# Patient Record
Sex: Female | Born: 1970 | Race: Black or African American | Hispanic: No | Marital: Married | Smoking: Never smoker
Health system: Southern US, Community
[De-identification: ages and names within clinical notes are randomized; demographics above are authoritative.]

## PROBLEM LIST (undated history)

## (undated) DIAGNOSIS — I10 Essential (primary) hypertension: Secondary | ICD-10-CM

---

## 1997-10-04 ENCOUNTER — Emergency Department (HOSPITAL_COMMUNITY): Admission: EM | Admit: 1997-10-04 | Discharge: 1997-10-04 | Payer: Self-pay | Admitting: Internal Medicine

## 1997-10-05 ENCOUNTER — Encounter: Admission: RE | Admit: 1997-10-05 | Discharge: 1998-01-03 | Payer: Self-pay | Admitting: Internal Medicine

## 2002-05-16 ENCOUNTER — Other Ambulatory Visit: Admission: RE | Admit: 2002-05-16 | Discharge: 2002-05-16 | Payer: Self-pay | Admitting: Gynecology

## 2003-06-13 ENCOUNTER — Other Ambulatory Visit: Admission: RE | Admit: 2003-06-13 | Discharge: 2003-06-13 | Payer: Self-pay | Admitting: Gynecology

## 2004-02-20 ENCOUNTER — Ambulatory Visit: Payer: Self-pay | Admitting: Internal Medicine

## 2004-06-04 ENCOUNTER — Other Ambulatory Visit: Admission: RE | Admit: 2004-06-04 | Discharge: 2004-06-04 | Payer: Self-pay | Admitting: Gynecology

## 2004-08-20 ENCOUNTER — Ambulatory Visit: Payer: Self-pay | Admitting: Internal Medicine

## 2004-09-27 ENCOUNTER — Ambulatory Visit: Payer: Self-pay | Admitting: Endocrinology

## 2005-01-10 ENCOUNTER — Ambulatory Visit: Payer: Self-pay | Admitting: Internal Medicine

## 2005-07-21 ENCOUNTER — Other Ambulatory Visit: Admission: RE | Admit: 2005-07-21 | Discharge: 2005-07-21 | Payer: Self-pay | Admitting: Gynecology

## 2005-08-05 ENCOUNTER — Ambulatory Visit: Payer: Self-pay | Admitting: Internal Medicine

## 2006-08-24 ENCOUNTER — Other Ambulatory Visit: Admission: RE | Admit: 2006-08-24 | Discharge: 2006-08-24 | Payer: Self-pay | Admitting: Gynecology

## 2007-01-16 ENCOUNTER — Encounter: Payer: Self-pay | Admitting: *Deleted

## 2007-01-16 DIAGNOSIS — N750 Cyst of Bartholin's gland: Secondary | ICD-10-CM | POA: Insufficient documentation

## 2007-01-16 DIAGNOSIS — Z87898 Personal history of other specified conditions: Secondary | ICD-10-CM | POA: Insufficient documentation

## 2007-01-16 DIAGNOSIS — K219 Gastro-esophageal reflux disease without esophagitis: Secondary | ICD-10-CM | POA: Insufficient documentation

## 2007-01-16 DIAGNOSIS — J309 Allergic rhinitis, unspecified: Secondary | ICD-10-CM | POA: Insufficient documentation

## 2008-04-13 ENCOUNTER — Other Ambulatory Visit: Payer: Self-pay | Admitting: Obstetrics and Gynecology

## 2008-04-14 ENCOUNTER — Inpatient Hospital Stay (HOSPITAL_COMMUNITY): Admission: RE | Admit: 2008-04-14 | Discharge: 2008-04-17 | Payer: Self-pay | Admitting: Obstetrics and Gynecology

## 2008-04-21 ENCOUNTER — Inpatient Hospital Stay (HOSPITAL_COMMUNITY): Admission: AD | Admit: 2008-04-21 | Discharge: 2008-04-21 | Payer: Self-pay | Admitting: Obstetrics and Gynecology

## 2008-05-01 ENCOUNTER — Ambulatory Visit: Admission: RE | Admit: 2008-05-01 | Discharge: 2008-05-01 | Payer: Self-pay | Admitting: Obstetrics and Gynecology

## 2010-06-18 LAB — COMPREHENSIVE METABOLIC PANEL
ALT: 14 U/L (ref 0–35)
ALT: 15 U/L (ref 0–35)
ALT: 26 U/L (ref 0–35)
AST: 17 U/L (ref 0–37)
AST: 28 U/L (ref 0–37)
AST: 26 U/L (ref 0–37)
Albumin: 2.6 g/dL — ABNORMAL LOW (ref 3.5–5.2)
Albumin: 3.2 g/dL — ABNORMAL LOW (ref 3.5–5.2)
Albumin: 3.5 g/dL (ref 3.5–5.2)
Alkaline Phosphatase: 114 U/L (ref 39–117)
Alkaline Phosphatase: 81 U/L (ref 39–117)
Alkaline Phosphatase: 92 U/L (ref 39–117)
BUN: 8 mg/dL (ref 6–23)
BUN: 9 mg/dL (ref 6–23)
BUN: 13 mg/dL (ref 6–23)
CO2: 23 mEq/L (ref 19–32)
CO2: 25 mEq/L (ref 19–32)
CO2: 27 mEq/L (ref 19–32)
Calcium: 8.2 mg/dL — ABNORMAL LOW (ref 8.4–10.5)
Calcium: 8.5 mg/dL (ref 8.4–10.5)
Calcium: 9.1 mg/dL (ref 8.4–10.5)
Chloride: 101 mEq/L (ref 96–112)
Chloride: 104 mEq/L (ref 96–112)
Chloride: 101 mEq/L (ref 96–112)
Creatinine, Ser: 0.65 mg/dL (ref 0.4–1.2)
Creatinine, Ser: 0.68 mg/dL (ref 0.4–1.2)
Creatinine, Ser: 0.84 mg/dL (ref 0.4–1.2)
GFR calc Af Amer: >60 mL/min (ref >60)
GFR calc Af Amer: >60 mL/min (ref >60)
GFR calc Af Amer: >60 mL/min (ref >60)
GFR calc non Af Amer: >60 mL/min (ref >60)
GFR calc non Af Amer: >60 mL/min (ref >60)
GFR calc non Af Amer: >60 mL/min (ref >60)
Glucose, Bld: 106 mg/dL — ABNORMAL HIGH (ref 70–99)
Glucose, Bld: 92 mg/dL (ref 70–99)
Glucose, Bld: 94 mg/dL (ref 70–99)
Potassium: 3.7 mEq/L (ref 3.5–5.1)
Potassium: 3.9 mEq/L (ref 3.5–5.1)
Potassium: 3.9 mEq/L (ref 3.5–5.1)
Sodium: 131 mEq/L — ABNORMAL LOW (ref 135–145)
Sodium: 134 mEq/L — ABNORMAL LOW (ref 135–145)
Sodium: 135 mEq/L (ref 135–145)
Total Bilirubin: 0.3 mg/dL (ref 0.3–1.2)
Total Bilirubin: 0.3 mg/dL (ref 0.3–1.2)
Total Bilirubin: 0.4 mg/dL (ref 0.3–1.2)
Total Protein: 4.9 g/dL — ABNORMAL LOW (ref 6.0–8.3)
Total Protein: 6.1 g/dL (ref 6.0–8.3)
Total Protein: 6.2 g/dL (ref 6.0–8.3)

## 2010-06-18 LAB — CBC
HCT: 28.1 % — ABNORMAL LOW (ref 36.0–46.0)
HCT: 37.1 % (ref 36.0–46.0)
HCT: 34 % — ABNORMAL LOW (ref 36.0–46.0)
Hemoglobin: 12.3 g/dL (ref 12.0–15.0)
Hemoglobin: 9.4 g/dL — ABNORMAL LOW (ref 12.0–15.0)
Hemoglobin: 11.1 g/dL — ABNORMAL LOW (ref 12.0–15.0)
MCHC: 33.2 g/dL (ref 30.0–36.0)
MCHC: 33.6 g/dL (ref 30.0–36.0)
MCHC: 32.7 g/dL (ref 30.0–36.0)
MCV: 95.9 fL (ref 78.0–100.0)
MCV: 96.5 fL (ref 78.0–100.0)
MCV: 96.9 fL (ref 78.0–100.0)
Platelets: 175 10*3/uL (ref 150–400)
Platelets: 199 10*3/uL (ref 150–400)
Platelets: 368 10*3/uL (ref 150–400)
RBC: 2.91 MIL/uL — ABNORMAL LOW (ref 3.87–5.11)
RBC: 3.87 MIL/uL (ref 3.87–5.11)
RBC: 3.51 MIL/uL — ABNORMAL LOW (ref 3.87–5.11)
RDW: 13.8 % (ref 11.5–15.5)
RDW: 14.4 % (ref 11.5–15.5)
RDW: 14.4 % (ref 11.5–15.5)
WBC: 11.4 10*3/uL — ABNORMAL HIGH (ref 4.0–10.5)
WBC: 7.7 10*3/uL (ref 4.0–10.5)
WBC: 8 10*3/uL (ref 4.0–10.5)

## 2010-06-18 LAB — TYPE AND SCREEN
ABO/RH(D): O POS
Antibody Screen: NEGATIVE

## 2010-06-18 LAB — URINALYSIS, DIPSTICK ONLY
Bilirubin Urine: NEGATIVE
Glucose, UA: NEGATIVE mg/dL
Ketones, ur: NEGATIVE mg/dL
Leukocytes, UA: NEGATIVE
Nitrite: NEGATIVE
Protein, ur: 100 mg/dL — AB
Specific Gravity, Urine: 1.015 (ref 1.005–1.030)
Urobilinogen, UA: 0.2 mg/dL (ref 0.0–1.0)
pH: 7 (ref 5.0–8.0)

## 2010-06-18 LAB — NO BLOOD PRODUCTS

## 2010-06-18 LAB — RPR: RPR Ser Ql: NONREACTIVE

## 2010-06-18 LAB — LACTATE DEHYDROGENASE: LDH: 248 U/L (ref 94–250)

## 2010-06-18 LAB — URIC ACID
Uric Acid, Serum: 4.8 mg/dL (ref 2.4–7.0)
Uric Acid, Serum: 5.2 mg/dL (ref 2.4–7.0)
Uric Acid, Serum: 4.9 mg/dL (ref 2.4–7.0)

## 2010-06-18 LAB — ABO/RH: ABO/RH(D): O POS

## 2010-07-16 NOTE — H&P (Signed)
NAME:  Michelle Jarvis, Michelle Jarvis NO.:  [id number]   MEDICAL RECORD NO.:  [id number]          PATIENT TYPE:  INP   LOCATION:                                FACILITY:  [facility]   PHYSICIAN:  Huel Cote, M.D. DATE OF BIRTH:  04/01/1970   DATE OF ADMISSION:  04/14/2008  DATE OF DISCHARGE:                              HISTORY & PHYSICAL   Patient is a 40 year old G2, P0-0-1-0, who is being admitted at [redacted] weeks  gestation to undergo an elective C-section.  Since the beginning of her  pregnancy, the patient has remained adamant that she wishes no trial of  labor.  We have discussed in full detail the risks and benefits of  surgery, versus a normal vaginal delivery and the patient is aware of  all of this and desires to proceed with an elective cesarean section.  She has had a pregnancy which has been complicated by some elevated  blood pressures and being followed closely with twice-weekly NSTs.  Her  blood pressures first were noted to be elevated around 34 weeks and  since that time she has been followed with nonstress test and PIH labs.  She had no significant proteinuria until her last visit on February 9,  at which point she had trace to 1+ proteinuria.  Her blood pressures at  that time were 140/100 and her PIH labs were completely within normal  limits.  She did not have any pre-eclamptic symptoms and, therefore, was  monitored with a nonstress test and felt stable to await her C-section  two days later.  She otherwise has had a relatively uneventful  pregnancy.  She is AMA with a normal first-trimester screen.   PRENATAL LABS:  O-positive, antibody-negative, RPR-nonreactive, rubella-  immune, hepatitis C surface antigen-negative, HIV-negative, GC-negative,  chlamydia-negative.  One-hour Glucola was 126.  First trimester screen  was normal.  Group B strep was negative.   PAST OBSTETRICAL HISTORY:  Significant only for one elective abortion in  1999.   PAST GYN HISTORY:   She had a LEEP performed in 1995 and has had normal  Pap smears since that time.   PAST SURGICAL HISTORY:  In 1999, she had a right breast lumpectomy,  which was benign.   PAST MEDICAL HISTORY:  None significant.   PHYSICAL EXAM:  Patient's weight is 210 pounds.  As stated, blood  pressure was 140s/100.  CARDIAC EXAM:  Regular rate and rhythm.  LUNGS:  Clear.  ABDOMEN:  Soft, gravid, nontender.  Cervix is 50 and .  EXTREMITIES:  Her edema has been significant, 2 to 3+ for several weeks.  Deep tendon reflexes were 2+ and 3+, respectively on the left and the  right.   The patient reported no pre-eclamptic symptoms.  On February 10 she was  called to ensure she was feeling well.  Baby had normal movement and  there were no other issues.  As stated, her pre-eclamptic labs were  normal and she was advised to proceed to the hospital the following  morning, February 11, for her preoperative labs, which will include a  PIH panel.  The patient has been counseled extensively as to the risks  and benefits of cesarean section and it is felt she is making an  educated choice to proceed with an elective cesarean section.  She  understands that there is  a slightly higher risk of blood loss and the recovery time is longer, as  well.  We will proceed with C-section, as stated, unless the patient  develops any symptoms or issues with possible pre-eclampsia prior to the  morning of February 12, and we will repeat her labs 48 hours after the  last ones were done.      Huel Cote, M.D.  Electronically Signed     KR/MEDQ  D:  04/12/2008  T:  04/12/2008  Job:  [id number]

## 2010-07-16 NOTE — Op Note (Signed)
NAME:  Michelle Jarvis, Michelle Jarvis NO.:  [id number]   MEDICAL RECORD NO.:  [id number]          PATIENT TYPE:  INP   LOCATION:  9117                          FACILITY:  [facility]   PHYSICIAN:  Huel Cote, M.D. DATE OF BIRTH:  10/03/1970   DATE OF PROCEDURE:  04/14/2008  DATE OF DISCHARGE:                               OPERATIVE REPORT   PREOPERATIVE DIAGNOSES:  1. Term pregnancy at 39 weeks.  2. Pregnancy-induced hypertension  3. The patient requests elective cesarean section.   POSTOPERATIVE DIAGNOSES:  1. Term pregnancy at 39 weeks.  2. Pregnancy-induced hypertension  3. The patient requests elective cesarean section.   PROCEDURE:  Primary low transverse cesarean section with two-layer  closure of uterus.   SURGEON:  Huel Cote, M.D.   ASSISTANT:  Zenaida Niece, M.D.   ANESTHESIA:  Spinal.   FINDINGS:  Normal uterus, tubes, and ovaries noted.  Vigorous female  infant was delivered, in vertex presentation.  Apgars were 9 and 9.  Weight was 7 pounds 2 ounces.  There was a nuchal cord x1.   SPECIMEN:  Placenta was sent to L and D.   ESTIMATED BLOOD LOSS:  100 mL.   URINE OUTPUT:  150 mL of clear urine.   IV FLUIDS:  Approximately 2 L of lactated Ringer's.   COMPLICATIONS:  No known complications.   DESCRIPTION OF PROCEDURE:  The patient was taken to the operating room  where spinal anesthesia was obtained without difficulty.  She was then  prepped and draped in normal sterile fashion in dorsal supine position  with a leftward tilt.  A Pfannenstiel skin incision was then made and  carried through to the underlying layer of fascia by sharp dissection  and Bovie cautery.  The fascia was then nicked to the midline and the  incision was extended laterally with Mayo scissors.  The superior aspect  was grasped with the Kocher clamps, elevated and dissected off the  rectus muscles.  The inferior aspect was likewise dissected off the  rectus muscles.  These  were separated in midline and the peritoneal  cavity was entered bluntly.  Peritoneal incision was then extended both  superiorly and inferiorly with careful attention to avoid both bowel  bladder.  The Alexis self-retaining wound retractor was then placed  within the incision and the lower uterine segment exposed nicely.  The  lower uterine segment was then incised in transverse fashion to create  the bladder flap and then again to begin to make an incision on the  cavity itself.  This was incised transversely and the cavity was entered  bluntly.  The fluid was clear.  The infant's head was then delivered  atraumatically, bulb suctioned and the remainder of the body delivered  without difficulty.  There was a nuchal cord x1, which was reduced after  delivery of the head.  Cord was clamped and cut.  The infant handed to  awaiting pediatricians.  After cord blood was obtained, the placenta was  delivered from the uterus and the uterus cleared of all clots, debris  with moist lap sponge.  The uterine incision was then closed in 2  layers, the first a running locked layer of 1-0 chromic position.  This  second, an imbricating layer of the same.  The angles were noted to have  a small amount of bleeding and these were secured with additional figure-  of-eight sutures of 0 chromic, all appeared hemostatic at this point.  The tubes and ovaries were inspected.  The gutters cleared of all clots  and debris.  Some irrigation was performed in abdomen and pelvis and the  incision then carefully inspected with no active bleeding noted.  Therefore, all instruments and sponges were removed from the abdomen and  the subfascial planes inspected and found to be hemostatic.  The rectus  muscles were reapproximated with several interrupted sutures of 0  Vicryl.  The fascia was closed with 0 Vicryl in a running fashion and  the skin was closed with staples.  Sponge, lap, and needle counts were  correct x2 and  the patient was taken to the recovery room in stable  condition.      Huel Cote, M.D.  Electronically Signed     KR/MEDQ  D:  04/14/2008  T:  04/15/2008  Job:  [id number]

## 2010-07-19 NOTE — Discharge Summary (Signed)
NAME:  Michelle Jarvis, Michelle Jarvis NO.:  [id number]   MEDICAL RECORD NO.:  [id number]          PATIENT TYPE:  MAT   LOCATION:  [facility]                          [facility]ILITY:  [facility]   PHYSICIAN:  Huel Cote, M.D. DATE OF BIRTH:  October 03, 1970   DATE OF ADMISSION:  DATE OF DISCHARGE:                               DISCHARGE SUMMARY   DISCHARGE DIAGNOSES:  1. Term pregnancy at 39 weeks, delivered.  2. Pregnancy-induced hypertension.  3. Elective cesarean section, desired by the patient and performed.  4. Primary low transverse cesarean section with two-layer closure of      uterus.   DISCHARGE MEDICATIONS:  1. Motrin 600 mg p.o. every 6 hours p.r.n.  2. Oxycodone 5 mg p.o. every 6 hours p.r.n., #30.   DISCHARGE FOLLOWUP:  The patient is to follow up in the office in 2-3  days for a blood pressure check or had a called in by the [employer] visiting nurse.   HOSPITAL COURSE:  The patient is a 40 year old, G2, P0-0-1-0, who came  in for an elective scheduled primary cesarean section at 11 weeks'  gestation.  The patient declined all trial of labor and was counseled  carefully as to the pluses and minuses of an operative delivery and  desired to proceed with this route of delivery.  She underwent a primary  low transverse C-section on April 14, 2008, was delivered of a  vigorous female infant, Apgars were 9 and 9, weight was 7 pounds 2  ounces.  She had normal anatomy noted at the time of C-section.  She was  admitted for routine postoperative care.  She did fairly well except for  continued elevated blood pressures to 160s/90s.  She had no proteinuria  or abnormal PIH labs and no PIH symptoms.  By postop day #3, she was  doing quite well, tolerating regular diet.  Blood pressure was more  consistently 140s/90s and was not overall elevated enough to warrant  medications.  Therefore, she was discharged home and follow up in the  office in 2-3 days and had her staples removed  and Steri-Strips placed  prior to discharge.      Huel Cote, M.D.  Electronically Signed     KR/MEDQ  D:  05/10/2008  T:  05/11/2008  Job:  [id number]

## 2011-09-18 ENCOUNTER — Ambulatory Visit: Payer: Self-pay | Admitting: Internal Medicine

## 2011-09-18 DIAGNOSIS — Z0289 Encounter for other administrative examinations: Secondary | ICD-10-CM

## 2013-09-19 ENCOUNTER — Other Ambulatory Visit: Payer: Self-pay | Admitting: Obstetrics and Gynecology

## 2013-09-19 DIAGNOSIS — R928 Other abnormal and inconclusive findings on diagnostic imaging of breast: Secondary | ICD-10-CM

## 2013-09-22 ENCOUNTER — Ambulatory Visit
Admission: RE | Admit: 2013-09-22 | Discharge: 2013-09-22 | Disposition: A | Discharge status: Home / Self Care | Payer: BC Managed Care – PPO | Source: Ambulatory Visit | Attending: Obstetrics and Gynecology | Admitting: Obstetrics and Gynecology

## 2013-09-22 DIAGNOSIS — R928 Other abnormal and inconclusive findings on diagnostic imaging of breast: Secondary | ICD-10-CM

## 2018-05-21 ENCOUNTER — Other Ambulatory Visit: Payer: Self-pay

## 2018-05-21 ENCOUNTER — Encounter (HOSPITAL_COMMUNITY): Payer: Self-pay | Admitting: Emergency Medicine

## 2018-05-21 ENCOUNTER — Ambulatory Visit (HOSPITAL_COMMUNITY)
Admission: EM | Admit: 2018-05-21 | Discharge: 2018-05-21 | Disposition: A | Discharge status: Home / Self Care | Payer: BLUE CROSS/BLUE SHIELD | Attending: Family Medicine | Admitting: Family Medicine

## 2018-05-21 DIAGNOSIS — R1031 Right lower quadrant pain: Secondary | ICD-10-CM

## 2018-05-21 DIAGNOSIS — R300 Dysuria: Secondary | ICD-10-CM | POA: Diagnosis not present

## 2018-05-21 LAB — POCT URINALYSIS DIP (DEVICE)
Bilirubin Urine: NEGATIVE
Glucose, UA: NEGATIVE mg/dL
Hgb urine dipstick: NEGATIVE
Ketones, ur: NEGATIVE mg/dL
Leukocytes,Ua: NEGATIVE
Nitrite: NEGATIVE
Protein, ur: NEGATIVE mg/dL
Specific Gravity, Urine: 1.025 (ref 1.005–1.030)
Urobilinogen, UA: 0.2 mg/dL (ref 0.0–1.0)
pH: 6.5 (ref 5.0–8.0)

## 2018-05-21 MED ORDER — PHENAZOPYRIDINE HCL 200 MG PO TABS: 200.0000 mg | ORAL_TABLET | Freq: Three times a day (TID) | ORAL | 0 refills | Status: DC

## 2018-05-21 NOTE — ED Triage Notes (Addendum)
Pt  Is complaining of abdominal pain and is worried about a possible  UTI .Also having seasonal allergie and is having some headaches Pressure on the right side of her head. No fevers, no chills, no cough or congestion.

## 2018-05-21 NOTE — Discharge Instructions (Signed)
Increase your water intake.  Your urine is completely normal today.  Ovarian cyst, gas or appendicitis can all be considered with your right lower quadrant pain.  Please return for any worsening of pain, fevers, chills, nausea vomiting.  May use pyridium as needed for the next 2-3 days to see if this is helpful with your urine.  If your urinary symptoms worsen please return, don't take the pyridium prior to arrival as it may alter your urine sample.

## 2018-05-21 NOTE — ED Provider Notes (Signed)
[facility]    CSN: [id number] Arrival date & time: 05/21/18  1844     History   Chief Complaint Chief Complaint  Patient presents with  . Headache  . Urinary Frequency    hurting in abdomen     HPI Michelle Jarvis is a 48 y.o. female.   Michelle Jarvis presents with complaints of "noticeable discomfort" to RLQ which started three days ago. Was worse once while walking her dog, has has improved. Hasn't worsened. States she has been drinking milk with cereal which she typically doesn't, and it has caused some loose stools over the past few days. No nausea or vomiting. Has had loose stools with milk in the past. States she has some stinging sensation with urination. No frequency. States she tends to hold her urine while she works. No urgency, no blood in urine. No vaginal symptoms. She has an IUD and doesn't have periods. She has had some headache. Took ibuprofen which helped yesterday, hasn't taken today. Pain 5/10. No light or sound sensitivity. No known ill contacts. No sore throat ear pain or congestion. No new back pain. States has had a UTI in the past but her symptoms worsened over time, they have not with this.     ROS per HPI, negative if not otherwise mentioned.      History reviewed. No pertinent past medical history.  Patient Active Problem List   Diagnosis Date Noted  . ALLERGIC RHINITIS 01/16/2007  . ESOPHAGEAL REFLUX 01/16/2007  . BARTHOLIN'S CYST 01/16/2007  . HYPERLIPIDEMIA, MILD, HX OF 01/16/2007    History reviewed. No pertinent surgical history.  OB History   No obstetric history on file.      Home Medications    Prior to Admission medications   Medication Sig Start Date End Date Taking? Authorizing Provider  phenazopyridine (PYRIDIUM) 200 MG tablet Take 1 tablet (200 mg total) by mouth 3 (three) times daily. 05/21/18   Georgetta Haber, NP    Family History No family history on file.  Social History Social History   Tobacco Use  .  Smoking status: Not on file  Substance Use Topics  . Alcohol use: Not on file  . Drug use: Not on file     Allergies   Patient has no allergy information on record.   Review of Systems Review of Systems   Physical Exam Triage Vital Signs ED Triage Vitals  Enc Vitals Group     BP 05/21/18 1855 (!) 147/107     Pulse Rate 05/21/18 1855 74     Resp 05/21/18 1855 16     Temp --      Temp src --      SpO2 05/21/18 1855 100 %     Weight --      Height --      Head Circumference --      Peak Flow --      Pain Score 05/21/18 1901 6     Pain Loc --      Pain Edu? --      Excl. in GC? --    No data found.  Updated Vital Signs BP (!) 147/107 (BP Location: Right Arm)   Pulse 74   Resp 16   SpO2 100%    Physical Exam Constitutional:      General: She is not in acute distress.    Appearance: She is well-developed.  Cardiovascular:     Rate and Rhythm: Normal rate and regular rhythm.  Heart sounds: Normal heart sounds.  Pulmonary:     Effort: Pulmonary effort is normal.     Breath sounds: Normal breath sounds.  Abdominal:     Tenderness: There is abdominal tenderness in the right lower quadrant. There is no right CVA tenderness, left CVA tenderness, guarding or rebound.     Comments: Mild right lower quadrant pain on palpation, "noticeable."   Skin:    General: Skin is warm and dry.  Neurological:     Mental Status: She is alert and oriented to person, place, and time.      UC Treatments / Results  Labs (all labs ordered are listed, but only abnormal results are displayed) Labs Reviewed  POCT URINALYSIS DIP (DEVICE)    EKG None  Radiology No results found.  Procedures Procedures (including critical care time)  Medications Ordered in UC Medications - No data to display  Initial Impression / Assessment and Plan / UC Course  I have reviewed the triage vital signs and the nursing notes.  Pertinent labs & imaging results that were available during  my care of the patient were reviewed by me and considered in my medical decision making (see chart for details).     Non toxic. Afebrile. No tachycardia. Appendicitis, ovarian cyst, kidney stone, UTI, gas considered in differentials. UA completely normal tonight. Abdominal pain has not worsened, has actually improved. No cva tenderness. No vaginal symptoms. Endorses limited oral intake while working and holding her urine. Encouraged to increase fluid and watchful waiting over the next 24-48 hours with strict return precautions. Patient verbalized understanding and agreeable to plan.  Ambulatory out of clinic without difficulty.   Final Clinical Impressions(s) / UC Diagnoses   Final diagnoses:  Dysuria  Right lower quadrant abdominal pain     Discharge Instructions     Increase your water intake.  Your urine is completely normal today.  Ovarian cyst, gas or appendicitis can all be considered with your right lower quadrant pain.  Please return for any worsening of pain, fevers, chills, nausea vomiting.  May use pyridium as needed for the next 2-3 days to see if this is helpful with your urine.  If your urinary symptoms worsen please return, don't take the pyridium prior to arrival as it may alter your urine sample.     ED Prescriptions    Medication Sig Dispense Auth. Provider   phenazopyridine (PYRIDIUM) 200 MG tablet Take 1 tablet (200 mg total) by mouth 3 (three) times daily. 6 tablet Georgetta Haber, NP     Controlled Substance Prescriptions [place] Controlled Substance Registry consulted? Not Applicable   Georgetta Haber, NP 05/21/18 2008

## 2019-12-17 ENCOUNTER — Ambulatory Visit
Admission: EM | Admit: 2019-12-17 | Discharge: 2019-12-17 | Disposition: A | Discharge status: Home / Self Care | Payer: No Typology Code available for payment source

## 2019-12-17 ENCOUNTER — Ambulatory Visit (INDEPENDENT_AMBULATORY_CARE_PROVIDER_SITE_OTHER): Payer: No Typology Code available for payment source

## 2019-12-17 ENCOUNTER — Encounter: Payer: Self-pay | Admitting: Emergency Medicine

## 2019-12-17 ENCOUNTER — Other Ambulatory Visit: Payer: Self-pay

## 2019-12-17 DIAGNOSIS — R079 Chest pain, unspecified: Secondary | ICD-10-CM | POA: Diagnosis not present

## 2019-12-17 DIAGNOSIS — Z8616 Personal history of COVID-19: Secondary | ICD-10-CM

## 2019-12-17 DIAGNOSIS — W57XXXA Bitten or stung by nonvenomous insect and other nonvenomous arthropods, initial encounter: Secondary | ICD-10-CM

## 2019-12-17 DIAGNOSIS — M940 Chondrocostal junction syndrome [Tietze]: Secondary | ICD-10-CM | POA: Diagnosis not present

## 2019-12-17 DIAGNOSIS — S1086XA Insect bite of other specified part of neck, initial encounter: Secondary | ICD-10-CM

## 2019-12-17 NOTE — ED Provider Notes (Signed)
[facility]    CSN: [id number] Arrival date & time: 12/17/19  9833      History   Chief Complaint Chief Complaint  Patient presents with  . Chest Pain    post covid    HPI Michelle Jarvis is a 49 y.o. female.   49 year old female comes in for central chest pain after diagnosis of Covid last month.  Aching in sensation, constant without obvious aggravating or alleviating factor.  Denies any associated shortness of breath.  States did have 1-2 episodes of nausea in the past month, but resolves on own and has been able to tolerate oral intake.  Denies residual URI symptoms.  Did have significant headache during Covid, and has since improved but with mild residual intermittently.  Denies fever.  Since Covid, has noticed increase in fatigue, and has had some exertional fatigue.  Denies dyspnea on exertion.  Occasional exertional chest pain.  Has still been able to do normal activities.  Never smoker.  Denies personal history of heart disease.  Maternal grandfather with first MI around mid 79s.  Mother with stent placement around mid to late 67s.  Of note, patient also states had tick bite 3 to 4 weeks ago.  Denies fever, muscle/joint pain, rashes.     History reviewed. No pertinent past medical history.  Patient Active Problem List   Diagnosis Date Noted  . ALLERGIC RHINITIS 01/16/2007  . ESOPHAGEAL REFLUX 01/16/2007  . BARTHOLIN'S CYST 01/16/2007  . HYPERLIPIDEMIA, MILD, HX OF 01/16/2007    History reviewed. No pertinent surgical history.  OB History   No obstetric history on file.      Home Medications    Prior to Admission medications   Medication Sig Start Date End Date Taking? Authorizing Provider  levonorgestrel (MIRENA) 20 MCG/24HR IUD 1 each by Intrauterine route once.    [provider]  phenazopyridine (PYRIDIUM) 200 MG tablet Take 1 tablet (200 mg total) by mouth 3 (three) times daily. 05/21/18   Georgetta Haber, NP    Family  History History reviewed. No pertinent family history.  Social History Social History   Tobacco Use  . Smoking status: Never Smoker  . Smokeless tobacco: Never Used  Vaping Use  . Vaping Use: Never used  Substance Use Topics  . Alcohol use: Not on file  . Drug use: Not on file     Allergies   Patient has no known allergies.   Review of Systems Review of Systems  Reason unable to perform ROS: See HPI as above.     Physical Exam Triage Vital Signs ED Triage Vitals  Enc Vitals Group     BP 12/17/19 0954 (!) 146/93     Pulse Rate 12/17/19 0953 73     Resp 12/17/19 0953 16     Temp 12/17/19 0953 98.7 F (37.1 C)     Temp Source 12/17/19 0953 Oral     SpO2 12/17/19 0953 98 %     Weight --      Height --      Head Circumference --      Peak Flow --      Pain Score --      Pain Loc --      Pain Edu? --      Excl. in GC? --    No data found.  Updated Vital Signs BP (!) 146/93   Pulse 73   Temp 98.7 F (37.1 C) (Oral)   Resp 16  SpO2 98%   Physical Exam Constitutional:      General: She is not in acute distress.    Appearance: Normal appearance. She is well-developed. She is not toxic-appearing or diaphoretic.  HENT:     Head: Normocephalic and atraumatic.  Eyes:     Conjunctiva/sclera: Conjunctivae normal.     Pupils: Pupils are equal, round, and reactive to light.  Cardiovascular:     Rate and Rhythm: Normal rate and regular rhythm.     Heart sounds: Normal heart sounds. No murmur heard.  No friction rub. No gallop.   Pulmonary:     Effort: Pulmonary effort is normal. No respiratory distress.     Comments: LCTAB Chest:     Comments: Tenderness along sternum/right chest Abdominal:     General: Bowel sounds are normal.     Palpations: Abdomen is soft.     Tenderness: There is no abdominal tenderness. There is no guarding or rebound.  Musculoskeletal:     Cervical back: Normal range of motion and neck supple.  Skin:    General: Skin is warm and  dry.  Neurological:     Mental Status: She is alert and oriented to person, place, and time.      UC Treatments / Results  Labs (all labs ordered are listed, but only abnormal results are displayed) Labs Reviewed  ROCKY MTN SPOTTED FVR ABS PNL(IGG+IGM)  B. BURGDORFI ANTIBODIES    EKG   Radiology DG Chest 2 View  Result Date: 12/17/2019 CLINICAL DATA:  Chest pain.  Recent COVID-19 positive EXAM: CHEST - 2 VIEW COMPARISON:  None. FINDINGS: Lungs are clear. Heart size and pulmonary vascularity are normal. No adenopathy. No pneumothorax. There is mild lower thoracic dextroscoliosis. IMPRESSION: Lungs clear.  Cardiac silhouette normal. Electronically Signed   By: Bretta Bang III M.D.   On: 12/17/2019 10:50    Procedures Procedures (including critical care time)  Medications Ordered in UC Medications - No data to display  Initial Impression / Assessment and Plan / UC Course  I have reviewed the triage vital signs and the nursing notes.  Pertinent labs & imaging results that were available during my care of the patient were reviewed by me and considered in my medical decision making (see chart for details).    EKG sinus bradycardia, 56bpm, no acute ST changes, no prior EKG for comparison.  Chest x-ray without active cardiopulmonary disease.  Chest pain reproducible by palpation.  Discussed consistent with costochondritis, which may contribute to exertional chest pain.  However, given family history, will have patient follow-up with PCP for further evaluation.  Will draw labs for RMSF, Lyme given tick bite and continued headache.  Return precautions given.  Patient expresses understanding and agrees to plan.  Final Clinical Impressions(s) / UC Diagnoses   Final diagnoses:  Costochondritis  Tick bite of other part of neck, initial encounter   ED Prescriptions    None     PDMP not reviewed this encounter.   Belinda Fisher, PA-C 12/17/19 1123

## 2019-12-17 NOTE — ED Triage Notes (Signed)
PT has been discussing BP meds with her PCP for years, but has been managing with diet and exercise. Since having COVID mid September, she she had central chest pressure and headaches.

## 2019-12-17 NOTE — Discharge Instructions (Signed)
EKG and chest xray without alarming signs. As discussed, this is likely due to inflammation to the sternum/muscles. Take ibuprofen 800mg  three times a day for 5-7 days. Please still follow up with PCP for reevaluation given family history. If sudden worsening of chest pain, develop shortness of breath, go to the emergency department for further evaluation.

## 2019-12-20 LAB — B. BURGDORFI ANTIBODIES: Lyme IgG/IgM Ab: <0.91 {ISR} (ref 0.00–0.90)

## 2019-12-20 LAB — ROCKY MTN SPOTTED FVR ABS PNL(IGG+IGM)
RMSF IgG: NEGATIVE
RMSF IgM: 0.53 index (ref 0.00–0.89)

## 2020-03-27 ENCOUNTER — Other Ambulatory Visit: Payer: Self-pay

## 2020-03-27 ENCOUNTER — Ambulatory Visit
Admission: EM | Admit: 2020-03-27 | Discharge: 2020-03-27 | Disposition: A | Discharge status: Home / Self Care | Payer: No Typology Code available for payment source | Attending: Emergency Medicine | Admitting: Emergency Medicine

## 2020-03-27 ENCOUNTER — Ambulatory Visit (INDEPENDENT_AMBULATORY_CARE_PROVIDER_SITE_OTHER): Payer: No Typology Code available for payment source

## 2020-03-27 DIAGNOSIS — S92531A Displaced fracture of distal phalanx of right lesser toe(s), initial encounter for closed fracture: Secondary | ICD-10-CM

## 2020-03-27 DIAGNOSIS — M79671 Pain in right foot: Secondary | ICD-10-CM | POA: Diagnosis not present

## 2020-03-27 NOTE — ED Provider Notes (Signed)
[facility]    CSN: [id number] Arrival date & time: 03/27/20  1258      History   Chief Complaint Chief Complaint  Patient presents with  . Toe Injury    HPI Michelle Jarvis is a 50 y.o. female   Presenting for right fourth toe pain.  States that she stubbed against a bed frame last night.  Felt like there was some deformity and swelling with minimal improvement today.  Has been icing frequently, taken ibuprofen with some relief.  History reviewed. No pertinent past medical history.  Patient Active Problem List   Diagnosis Date Noted  . ALLERGIC RHINITIS 01/16/2007  . ESOPHAGEAL REFLUX 01/16/2007  . BARTHOLIN'S CYST 01/16/2007  . HYPERLIPIDEMIA, MILD, HX OF 01/16/2007    History reviewed. No pertinent surgical history.  OB History   No obstetric history on file.      Home Medications    Prior to Admission medications   Medication Sig Start Date End Date Taking? Authorizing Provider  levonorgestrel (MIRENA) 20 MCG/24HR IUD 1 each by Intrauterine route once.    [provider]    Family History History reviewed. No pertinent family history.  Social History Social History   Tobacco Use  . Smoking status: Never Smoker  . Smokeless tobacco: Never Used  Vaping Use  . Vaping Use: Never used  Substance Use Topics  . Alcohol use: Yes  . Drug use: Not Currently     Allergies   Patient has no known allergies.   Review of Systems Review of Systems  Constitutional: Negative for fatigue and fever.  HENT: Negative for ear pain, sinus pain, sore throat and voice change.   Eyes: Negative for pain, redness and visual disturbance.  Respiratory: Negative for cough and shortness of breath.   Cardiovascular: Negative for chest pain and palpitations.  Gastrointestinal: Negative for abdominal pain, diarrhea and vomiting.  Musculoskeletal: Negative for arthralgias and myalgias.       (+) R toe pain  Skin: Negative for rash and wound.   Neurological: Negative for syncope and headaches.     Physical Exam Triage Vital Signs ED Triage Vitals  Enc Vitals Group     BP 03/27/20 1405 136/78     Pulse Rate 03/27/20 1405 74     Resp 03/27/20 1405 18     Temp 03/27/20 1405 98.5 F (36.9 C)     Temp Source 03/27/20 1405 Oral     SpO2 --      Weight --      Height --      Head Circumference --      Peak Flow --      Pain Score 03/27/20 1406 6     Pain Loc --      Pain Edu? --      Excl. in [place]? --    No data found.  Updated Vital Signs BP 136/78 (BP Location: Left Arm)   Pulse 74   Temp 98.5 F (36.9 C) (Oral)   Resp 18   Visual Acuity Right Eye Distance:   Left Eye Distance:   Bilateral Distance:    Right Eye Near:   Left Eye Near:    Bilateral Near:     Physical Exam Constitutional:      General: She is not in acute distress. HENT:     Head: Normocephalic and atraumatic.  Eyes:     General: No scleral icterus.    Pupils: Pupils are equal, round, and reactive to  light.  Cardiovascular:     Rate and Rhythm: Normal rate.  Pulmonary:     Effort: Pulmonary effort is normal.  Musculoskeletal:        General: Swelling and tenderness present. No deformity. Normal range of motion.     Comments: R 4th toe  Skin:    Coloration: Skin is not jaundiced or pale.     Findings: Bruising present.  Neurological:     General: No focal deficit present.     Mental Status: She is alert and oriented to person, place, and time.      UC Treatments / Results  Labs (all labs ordered are listed, but only abnormal results are displayed) Labs Reviewed - No data to display  EKG   Radiology DG Foot Complete Right  Result Date: 03/27/2020 CLINICAL DATA:  Injury. EXAM: RIGHT FOOT COMPLETE - 3+ VIEW COMPARISON:  No prior. FINDINGS: Minimally displaced oblique fractures noted about the proximal phalanx of the right fourth digit. No other acute bony abnormality identified. No radiopaque foreign body noted. IMPRESSION:  Displaced oblique fractures noted of the proximal phalanx of the right fourth digit. Electronically Signed   By: Maisie Fus  Register   On: 03/27/2020 14:52    Procedures Procedures (including critical care time)  Medications Ordered in UC Medications - No data to display  Initial Impression / Assessment and Plan / UC Course  I have reviewed the triage vital signs and the nursing notes.  Pertinent labs & imaging results that were available during my care of the patient were reviewed by me and considered in my medical decision making (see chart for details).     X-ray with displaced oblique fracture noted to the proximal phalanx of right fourth digit.  Placed in postop shoe, instructed follow-up with Ortho within a week.  Return precautions discussed, pt verbalized understanding and is agreeable to plan. Final Clinical Impressions(s) / UC Diagnoses   Final diagnoses:  Closed displaced fracture of distal phalanx of lesser toe of right foot, initial encounter   Discharge Instructions   None    ED Prescriptions    None     PDMP not reviewed this encounter.   Hall[place]vin, [place], [place] 03/27/20 1508

## 2020-03-27 NOTE — ED Triage Notes (Signed)
Pt states hit her rt 4th toe on her bed frame last night. Pt c/o deformity and swelling. States feeling numb today. States iced toe last night, took ibuprofen 600mg  today.

## 2021-02-14 ENCOUNTER — Other Ambulatory Visit: Payer: Self-pay

## 2021-02-14 ENCOUNTER — Ambulatory Visit (HOSPITAL_COMMUNITY)
Admission: EM | Admit: 2021-02-14 | Discharge: 2021-02-14 | Disposition: A | Discharge status: Home / Self Care | Payer: No Typology Code available for payment source | Attending: Family Medicine | Admitting: Family Medicine

## 2021-02-14 ENCOUNTER — Encounter (HOSPITAL_COMMUNITY): Payer: Self-pay | Admitting: Emergency Medicine

## 2021-02-14 DIAGNOSIS — R21 Rash and other nonspecific skin eruption: Secondary | ICD-10-CM

## 2021-02-14 DIAGNOSIS — N3001 Acute cystitis with hematuria: Secondary | ICD-10-CM

## 2021-02-14 LAB — POCT URINALYSIS DIPSTICK, ED / UC
Bilirubin Urine: NEGATIVE
Glucose, UA: NEGATIVE mg/dL
Hgb urine dipstick: NEGATIVE
Ketones, ur: NEGATIVE mg/dL
Nitrite: NEGATIVE
Protein, ur: NEGATIVE mg/dL
Specific Gravity, Urine: 1.02 (ref 1.005–1.030)
Urobilinogen, UA: 0.2 mg/dL (ref 0.0–1.0)
pH: 7 (ref 5.0–8.0)

## 2021-02-14 MED ORDER — CIPROFLOXACIN HCL 500 MG PO TABS: 500.0000 mg | ORAL_TABLET | Freq: Two times a day (BID) | ORAL | 0 refills | Status: AC

## 2021-02-14 NOTE — ED Triage Notes (Addendum)
Patient c/o dysuria x 10 days.   Patient endorses "pressure and pain while urinating" upon onset of symptoms.   Patient endorsed worsening of symptoms since onset.   Patient endorses hematuria. Patient endorses RT lower back pain. Patient states " when I drink something and eat something, I feel pressure like I got to pee and do a BM".   Patient has used AZO for 2 days with some relief of symptoms.   Patient c/o rash on LFT forearm x 8 days.   Patient endorses itchiness.   Patient states " I'm not sure if a spider bit me". Patient endorses " then the next day it felt like my right eye was swelling. Early this year in February I went to the doctor because of an insect bite on the same area".   Patient denies SOB, but " I feel uneasy".   Patient endorses RT throat irritation.   Patient has used neosporin with no relief of symptoms.

## 2021-02-14 NOTE — ED Provider Notes (Signed)
[facility]    CSN: [id number] Arrival date & time: 02/14/21  1930      History   Chief Complaint Chief Complaint  Patient presents with   Dysuria   Rash    HPI MONNETTE Jarvis is a 50 y.o. female.    Dysuria Rash Here for about 1 week h/o irritation when urinating, and then 12/7 she had more dysuria and urinary hesitancy. Also started seeing some flecks of blood. AZO helped for a few days.  Then felt a prick when she was going to the car on her left forearm on 12/12. Noted a little more of that sensation up her left arm, and later noted a little rash/redness on the forearm. Then 12/13 she noted some itching around her right eye, swelling a little around her lid. The swelling around her right eye lasted about 3 days.  She had stopped the azo before the rash began. Also her right side of her throat feels irritated.  No f/c. No URI symptoms. The dysuria/urinary irritation has returned in the last 1-2 days.  History reviewed. No pertinent past medical history.  Patient Active Problem List   Diagnosis Date Noted   ALLERGIC RHINITIS 01/16/2007   ESOPHAGEAL REFLUX 01/16/2007   BARTHOLIN'S CYST 01/16/2007   HYPERLIPIDEMIA, MILD, HX OF 01/16/2007    History reviewed. No pertinent surgical history.  OB History   No obstetric history on file.      Home Medications    Prior to Admission medications   Medication Sig Start Date End Date Taking? Authorizing Provider  ciprofloxacin (CIPRO) 500 MG tablet Take 1 tablet (500 mg total) by mouth 2 (two) times daily for 5 days. 02/14/21 02/19/21 Yes Barrett Henle, MD  levonorgestrel (MIRENA) 20 MCG/24HR IUD 1 each by Intrauterine route once.    [provider]    Family History History reviewed. No pertinent family history.  Social History Social History   Tobacco Use   Smoking status: Never   Smokeless tobacco: Never  Vaping Use   Vaping Use: Never used  Substance Use Topics   Alcohol use:  Yes   Drug use: Not Currently     Allergies   Patient has no known allergies.   Review of Systems Review of Systems  Genitourinary:  Positive for dysuria.  Skin:  Positive for rash.    Physical Exam Triage Vital Signs ED Triage Vitals  Enc Vitals Group     BP 02/14/21 1950 (!) 153/102     Pulse Rate 02/14/21 1950 68     Resp 02/14/21 1950 16     Temp 02/14/21 1950 98.8 F (37.1 C)     Temp Source 02/14/21 1950 Oral     SpO2 02/14/21 1950 98 %     Weight --      Height --      Head Circumference --      Peak Flow --      Pain Score 02/14/21 1959 0     Pain Loc --      Pain Edu? --      Excl. in [place]? --    No data found.  Updated Vital Signs BP (!) 153/102 (BP Location: Right Arm)    Pulse 68    Temp 98.8 F (37.1 C) (Oral)    Resp 16    LMP  (LMP Unknown)    SpO2 98%   Visual Acuity Right Eye Distance:   Left Eye Distance:   Bilateral Distance:  Right Eye Near:   Left Eye Near:    Bilateral Near:     Physical Exam Vitals and nursing note reviewed.  Constitutional:      General: She is not in acute distress.    Appearance: She is well-developed.  HENT:     Right Ear: Tympanic membrane normal.     Left Ear: Tympanic membrane normal.     Nose: Nose normal.     Mouth/Throat:     Mouth: Mucous membranes are moist.     Pharynx: No oropharyngeal exudate or posterior oropharyngeal erythema.  Eyes:     General:        Right eye: No discharge.        Left eye: No discharge.     Extraocular Movements: Extraocular movements intact.     Conjunctiva/sclera: Conjunctivae normal.     Pupils: Pupils are equal, round, and reactive to light.     Comments: Her upper left eye lid is slightly puffy. No erythema noted  Cardiovascular:     Rate and Rhythm: Normal rate and regular rhythm.     Heart sounds: No murmur heard. Pulmonary:     Effort: Pulmonary effort is normal. No respiratory distress.     Breath sounds: Normal breath sounds.  Abdominal:     Palpations:  Abdomen is soft.     Tenderness: There is abdominal tenderness (mild suprapubic tenderness).  Musculoskeletal:        General: No swelling.     Cervical back: Neck supple.  Skin:    Capillary Refill: Capillary refill takes less than 2 seconds.     Coloration: Skin is not jaundiced or pale.     Comments: Has an area of mild erythema, with excoriation, about 0.5 cm x 1 cm on her left forearm, not indurated.   Neurological:     General: No focal deficit present.     Mental Status: She is alert and oriented to person, place, and time.  Psychiatric:        Behavior: Behavior normal.     UC Treatments / Results  Labs (all labs ordered are listed, but only abnormal results are displayed) Labs Reviewed  POCT URINALYSIS DIPSTICK, ED / UC - Abnormal; Notable for the following components:      Result Value   Leukocytes,Ua MODERATE (*)    All other components within normal limits    EKG   Radiology No results found.  Procedures Procedures (including critical care time)  Medications Ordered in UC Medications - No data to display  Initial Impression / Assessment and Plan / UC Course  I have reviewed the triage vital signs and the nursing notes.  Pertinent labs & imaging results that were available during my care of the patient were reviewed by me and considered in my medical decision making (see chart for details).     UA shows moderate amount of WBC.  Suspect cystitis with her symptoms.  Also, consider allergic reaction--could be to insect, could be environmental, or ??to the AZO, delayed. She can take benadryl prn allergy symptoms   Final Clinical Impressions(s) / UC Diagnoses   Final diagnoses:  Acute cystitis with hematuria  Rash     Discharge Instructions      I do think you have a urinary infection, so I want you to take cipro 500 mg twice daily for 5 days. Drink plenty of fluids.  And you may be having allergy to something, but I am uncertain if it were an  insect sting, or to something inhaled, or to something in your food. I would like you to take benadryl as needed for the itching and allergy symptoms, every 6 hours.   Possibly you should see your primary provider about the allergy symptoms; they may end up referring you to an allergist.     ED Prescriptions     Medication Sig Dispense Auth. Provider   ciprofloxacin (CIPRO) 500 MG tablet Take 1 tablet (500 mg total) by mouth 2 (two) times daily for 5 days. 10 tablet Marlinda Mike Janace Aris, MD      PDMP not reviewed this encounter.   Zenia Resides, MD 02/14/21 2022

## 2021-02-14 NOTE — Discharge Instructions (Addendum)
I do think you have a urinary infection, so I want you to take cipro 500 mg twice daily for 5 days. Drink plenty of fluids.  And you may be having allergy to something, but I am uncertain if it were an insect sting, or to something inhaled, or to something in your food. I would like you to take benadryl as needed for the itching and allergy symptoms, every 6 hours.   Possibly you should see your primary provider about the allergy symptoms; they may end up referring you to an allergist.

## 2022-01-13 ENCOUNTER — Encounter (HOSPITAL_BASED_OUTPATIENT_CLINIC_OR_DEPARTMENT_OTHER): Payer: Self-pay | Admitting: Pediatrics

## 2022-01-13 ENCOUNTER — Emergency Department (HOSPITAL_BASED_OUTPATIENT_CLINIC_OR_DEPARTMENT_OTHER)
Admission: EM | Admit: 2022-01-13 | Discharge: 2022-01-13 | Disposition: A | Discharge status: Home / Self Care | Payer: No Typology Code available for payment source | Attending: Emergency Medicine | Admitting: Emergency Medicine

## 2022-01-13 ENCOUNTER — Other Ambulatory Visit: Payer: Self-pay

## 2022-01-13 DIAGNOSIS — Y92410 Unspecified street and highway as the place of occurrence of the external cause: Secondary | ICD-10-CM | POA: Insufficient documentation

## 2022-01-13 DIAGNOSIS — R079 Chest pain, unspecified: Secondary | ICD-10-CM | POA: Diagnosis not present

## 2022-01-13 DIAGNOSIS — M546 Pain in thoracic spine: Secondary | ICD-10-CM | POA: Diagnosis not present

## 2022-01-13 DIAGNOSIS — M79601 Pain in right arm: Secondary | ICD-10-CM | POA: Diagnosis not present

## 2022-01-13 DIAGNOSIS — M542 Cervicalgia: Secondary | ICD-10-CM | POA: Insufficient documentation

## 2022-01-13 HISTORY — DX: Essential (primary) hypertension: I10

## 2022-01-13 MED ORDER — KETOROLAC TROMETHAMINE 15 MG/ML IJ SOLN
15.0000 mg | Freq: Once | INTRAMUSCULAR | Status: AC
  Administered 2022-01-13: 15 mg via INTRAMUSCULAR
  Filled 2022-01-13: qty 1

## 2022-01-13 NOTE — Discharge Instructions (Signed)
Take 4 over the counter ibuprofen tablets 3 times a day or 2 over-the-counter naproxen tablets twice a day for pain. Also take tylenol 1000mg(2 extra strength) four times a day.    
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
## 2022-01-13 NOTE — ED Notes (Signed)
Discharge instructions reviewed with patient. Patient verbalizes understanding, no further questions at this time. Medications and follow up information provided. No acute distress noted at time of departure.  
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
## 2022-08-22 IMAGING — DX DG FOOT COMPLETE 3+V*R*
3 series · 3 of 3 positions shown · non-contrast
Comparison: No prior.

CLINICAL DATA: Injury.

EXAM:
RIGHT FOOT COMPLETE - 3+ VIEW

[foot supine dp]
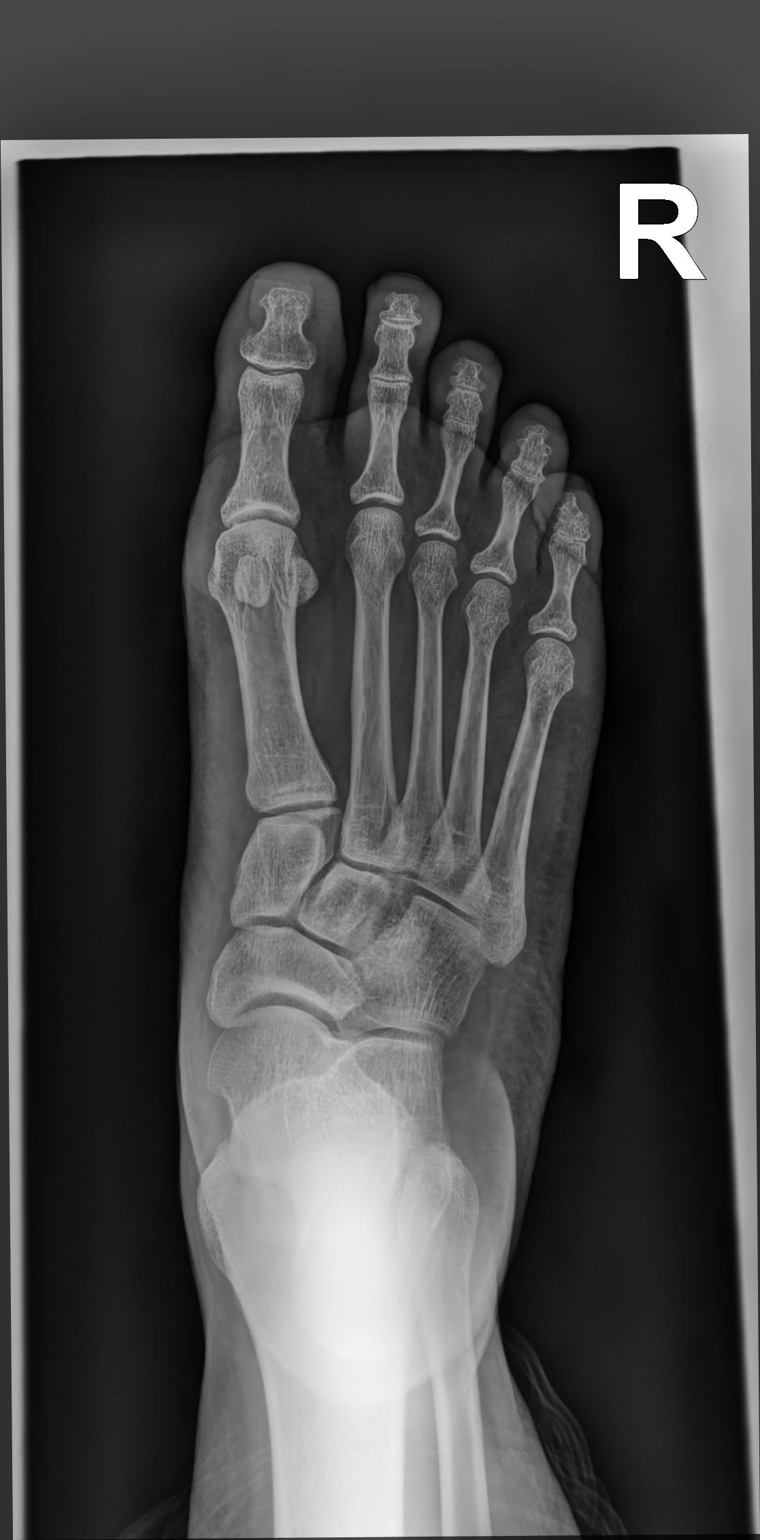

[foot medial oblique]
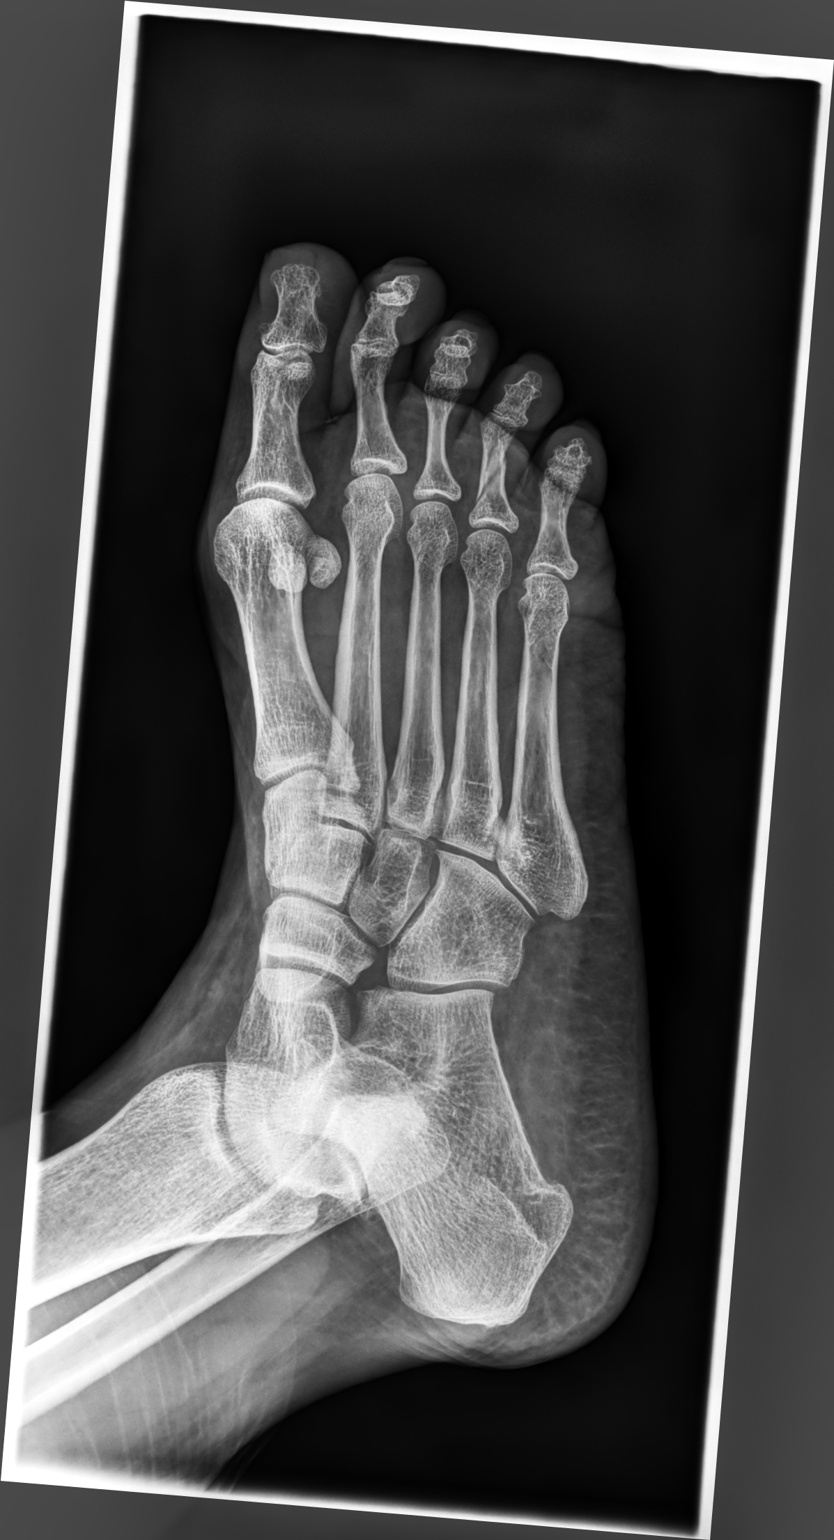

[foot supine lat]
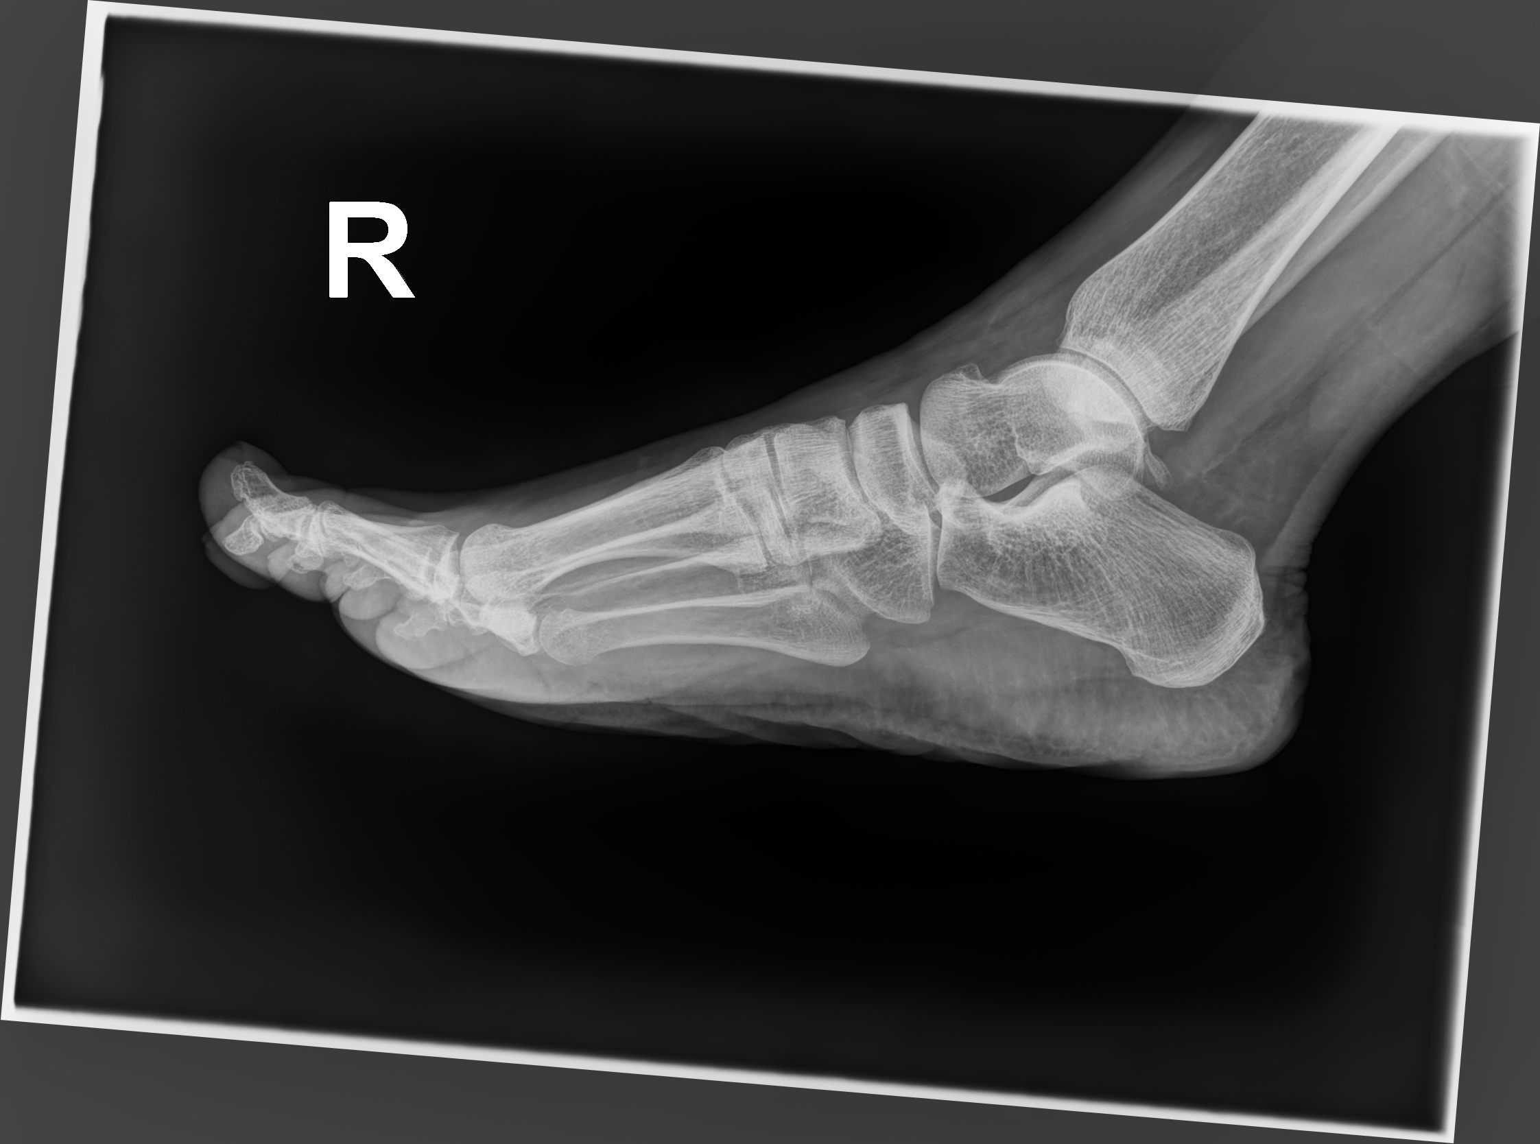

[3 of 3 positions shown; findings below may reference images not displayed]

FINDINGS: Minimally displaced oblique fractures noted about the proximal
phalanx of the right fourth digit. No other acute bony abnormality
identified. No radiopaque foreign body noted.
IMPRESSION: Displaced oblique fractures noted of the proximal phalanx of the
right fourth digit.

## 2023-07-03 ENCOUNTER — Ambulatory Visit
Admission: EM | Admit: 2023-07-03 | Discharge: 2023-07-03 | Disposition: A | Discharge status: Home / Self Care | Payer: Self-pay | Attending: Physician Assistant | Admitting: Physician Assistant

## 2023-07-03 ENCOUNTER — Other Ambulatory Visit: Payer: Self-pay

## 2023-07-03 DIAGNOSIS — R109 Unspecified abdominal pain: Secondary | ICD-10-CM

## 2023-07-03 DIAGNOSIS — R519 Headache, unspecified: Secondary | ICD-10-CM

## 2023-07-03 DIAGNOSIS — R030 Elevated blood-pressure reading, without diagnosis of hypertension: Secondary | ICD-10-CM

## 2023-07-03 NOTE — Discharge Instructions (Addendum)
 You were seen today for concerns of abdominal discomfort, burping and bowel habit changes as well as headaches. The symptoms can have a variety of causes that can be difficult to narrow down. At this time some of your abdominal concerns appear consistent with acid reflux or GERD.  I recommend starting with an acid reduction medication such as Pepcid once a day for at least 2 weeks.  I also recommend taking an antacid such as Pepto or Tums if you are having more severe symptoms. For your bowel habit changes I recommend trying changes to your diet such as increasing your fiber and avoiding GERD triggers.  I have included some patient education materials in your after visit summary for you to review to help with this.  If at any point you start to have severe abdominal pain, fever or chills that are not responding to over-the-counter medication, blood in your stool, difficulty having a bowel movement, severe nausea vomiting or diarrhea that prevent you from eating or drinking please go to the emergency room as these could be signs of a medical emergency.  Your headache symptoms are another condition that can have several different causes.  One could be mild dehydration or electrolyte imbalance caused by decreased eating.  Another could be elevated blood pressure.  Your blood pressure was mildly elevated here in clinic today but we typically do not start blood pressure medications for 1 individual blood pressure reading. I usually recommend that patients check their blood pressure a few times a week and record this and bring in records to discuss with her PCP.  Blood pressures taken at home and what can be seen in doctors offices typically provides a much more clear picture that is more accurate to determine if someone has high blood pressure and medication is indicated. For your headaches I recommend alternating Tylenol and ibuprofen as needed for pain management.  If at any point you feel like your headaches  are getting worse, it becomes the worst headache of your life, hits you very suddenly like a thunderclap, you start to develop difficulty speaking, you develop facial drooping, numbness or tingling on one side of the body, loss of consciousness please call EMS or go to the emergency room for further evaluation as these could be signs of a medical emergency.  We will keep you updated on the results of your lab work once it is available.  If any medications or adjustments to your management plan need to be made we will keep you updated and informed of those changes.  Since we were not able to assist you with establishing at a primary care provider's office I have linked the website you can use to make yourself an appointment at your earliest convenience for further evaluation as I do recommend ongoing evaluation for your concerns especially if they are not getting better.  [email or web address]

## 2023-07-03 NOTE — ED Triage Notes (Signed)
 Pt presents with complaints of headaches and upset stomach x 1 week. Pt states she has a decreased appetite, "feeling off overall". Pt has been feeling hot and then cold. OTC Ibuprofen taken with little relief. Pt currently rates her headache pain a 7/10.   Pt is also voicing concerns of elevated blood pressure. Pt did take medicine for her blood pressure about two years ago and then stopped. Currently not on medicine for BP.

## 2023-07-03 NOTE — ED Notes (Signed)
 This RN went in to schedule patient's primary care appointment. Pt left following blood work and speaking with [place] PA.

## 2023-07-03 NOTE — ED Provider Notes (Addendum)
 [facility]    CSN: [id number] Arrival date & time: 07/03/23  1607      History   Chief Complaint Chief Complaint  Patient presents with   Headache    HPI Michelle Jarvis is a 53 y.o. female.   HPI  She reports she has been having headaches, upset stomach, intermittent stool changes, appetite decreased She reports this has been ongoing for about a week   Abdominal symptoms She reports having bubbling and intermittent pain in abdomen. She reports increased burping She denies severe pain but there sometimes is mild discomfort and decreased appetite She reports her pain/discomfort is generalized She states discomfort seems to improve with PO intake and eating  She states she has had some bowel changes- not really soft or hard  but it is not regular for her  She reports stools are soft and seem to have increased odor  She reports about a month ago, she and her husband had a stomach illness with nausea, vomiting, upset stomach that took about a week to recover from  She also reports chills over the past few days   Headache She reports she has been having recurrent headache She states pain is oscillating between 3/10 to 6-7/10 She denies recent head injury or trauma She reports Ibuprofen sometimes helps with headache but she is not sure if this is truly helping   She reports earlier this week she has been feeling fatigued and tired - wanted to sleep more than usual   She admits that several years ago she was on a medication for blood pressure control but is not sure what this was and has not seen a PCP in some time for follow-up.  Past Medical History:  Diagnosis Date   Hypertension     Patient Active Problem List   Diagnosis Date Noted   ALLERGIC RHINITIS 01/16/2007   ESOPHAGEAL REFLUX 01/16/2007   BARTHOLIN'S CYST 01/16/2007   HYPERLIPIDEMIA, MILD, HX OF 01/16/2007    History reviewed. No pertinent surgical history.  OB History   No obstetric  history on file.      Home Medications    Prior to Admission medications   Medication Sig Start Date End Date Taking? Authorizing Provider  levonorgestrel (MIRENA) 20 MCG/24HR IUD 1 each by Intrauterine route once.    [provider]    Family History History reviewed. No pertinent family history.  Social History Social History   Tobacco Use   Smoking status: Never   Smokeless tobacco: Never  Vaping Use   Vaping status: Never Used  Substance Use Topics   Alcohol use: Yes   Drug use: Not Currently     Allergies   Patient has no known allergies.   Review of Systems Review of Systems  Constitutional:  Positive for appetite change, chills and fatigue. Negative for fever.  Gastrointestinal:  Positive for abdominal pain. Negative for blood in stool, constipation, diarrhea, nausea and vomiting.  Neurological:  Positive for light-headedness and headaches. Negative for dizziness.     Physical Exam Triage Vital Signs ED Triage Vitals  Encounter Vitals Group     BP 07/03/23 1623 (!) 149/97     Systolic BP Percentile --      Diastolic BP Percentile --      Pulse Rate 07/03/23 1623 74     Resp 07/03/23 1623 18     Temp 07/03/23 1623 97.9 F (36.6 C)     Temp Source 07/03/23 1623 Oral  SpO2 07/03/23 1623 98 %     Weight 07/03/23 1626 180 lb (81.6 kg)     Height 07/03/23 1626 5' 2.5" (1.588 m)     Head Circumference --      Peak Flow --      Pain Score 07/03/23 1625 7     Pain Loc --      Pain Education --      Exclude from Growth Chart --    No data found.  Updated Vital Signs BP (!) 149/97 (BP Location: Right Arm)   Pulse 74   Temp 97.9 F (36.6 C) (Oral)   Resp 18   Ht 5' 2.5" (1.588 m)   Wt 180 lb (81.6 kg)   SpO2 98%   BMI 32.40 kg/m   Visual Acuity Right Eye Distance:   Left Eye Distance:   Bilateral Distance:    Right Eye Near:   Left Eye Near:    Bilateral Near:     Physical Exam Vitals reviewed.  Constitutional:       General: She is awake. She is not in acute distress.    Appearance: Normal appearance. She is well-developed and well-groomed. She is not ill-appearing or toxic-appearing.  HENT:     Head: Normocephalic and atraumatic.     Right Ear: Hearing, tympanic membrane and ear canal normal.     Left Ear: Hearing, tympanic membrane and ear canal normal.     Mouth/Throat:     Lips: Pink.     Mouth: Mucous membranes are moist.     Pharynx: Oropharynx is clear. Uvula midline.  Eyes:     General: Lids are normal. Gaze aligned appropriately.     Extraocular Movements: Extraocular movements intact.     Right eye: Normal extraocular motion and no nystagmus.     Left eye: Normal extraocular motion and no nystagmus.     Conjunctiva/sclera: Conjunctivae normal.     Pupils: Pupils are equal, round, and reactive to light.  Neck:   Cardiovascular:     Rate and Rhythm: Normal rate and regular rhythm.     Heart sounds: Normal heart sounds. No murmur heard.    No friction rub. No gallop.  Pulmonary:     Effort: Pulmonary effort is normal.     Breath sounds: Normal breath sounds. No decreased air movement. No decreased breath sounds, wheezing, rhonchi or rales.  Abdominal:     General: Abdomen is flat. Bowel sounds are normal.     Palpations: Abdomen is soft.     Tenderness: There is no abdominal tenderness. There is no guarding or rebound. Negative signs include Murphy's sign and McBurney's sign.     Hernia: There is no hernia in the umbilical area or ventral area.  Musculoskeletal:     Cervical back: Normal range of motion and neck supple. Muscular tenderness present.  Skin:    General: Skin is warm and dry.  Neurological:     Mental Status: She is alert and oriented to person, place, and time.     GCS: GCS eye subscore is 4. GCS verbal subscore is 5. GCS motor subscore is 6.     Cranial Nerves: No cranial nerve deficit, dysarthria or facial asymmetry.     Motor: No tremor or abnormal muscle tone.      Gait: Gait is intact.  Psychiatric:        Attention and Perception: Attention and perception normal.        Mood and Affect: Mood and  affect normal.        Speech: Speech normal.        Behavior: Behavior normal. Behavior is cooperative.        Thought Content: Thought content normal.        Cognition and Memory: Cognition normal.      UC Treatments / Results  Labs (all labs ordered are listed, but only abnormal results are displayed) Labs Reviewed  CBC WITH DIFFERENTIAL/PLATELET  COMPREHENSIVE METABOLIC PANEL WITH GFR  LIPASE    EKG   Radiology No results found.  Procedures Procedures (including critical care time)  Medications Ordered in UC Medications - No data to display  Initial Impression / Assessment and Plan / UC Course  I have reviewed the triage vital signs and the nursing notes.  Pertinent labs & imaging results that were available during my care of the patient were reviewed by me and considered in my medical decision making (see chart for details).      Final Clinical Impressions(s) / UC Diagnoses   Final diagnoses:  Generalized headaches  Abdominal discomfort  Elevated BP without diagnosis of hypertension   Patient presents today with concerns for generalized headaches, abdominal discomfort and concern for elevated blood pressure.  Abdominal discomfort Patient reports that she has had abdominal discomfort, increased belching, changes to bowel habits for the past few days.  She reports that about a month ago she and her husband were both sick with GI illness but they have both seemingly recovered at this time.  She reports continued fatigue and headache which may or may not be associated with current symptoms.  At this time suspect that she is still recovering from GI illness and may have a mild GERD flare as well as mild colitis.  Reviewed that she should try taking an acid prevention medication such as Pepcid and using Pepto or Tums for symptomatic  relief.  Will get CMP, CBC, lipase for further rule out.  Reviewed with patient that this should show evidence of electrolyte abnormality, anemia, inflammation to pancreas.  Results to dictate further management once lab work is available.  For bowel habit issues recommend increasing fiber intake and avoiding GERD trigger foods for the next few weeks.  ED and return precautions reviewed and provided in after visit summary.  Offered to establish patient with PCP appointment which patient was amenable to but she did have to leave due to her daughter having appointment before nurse could assist with scheduling.  PCP scheduling website was included in after visit summary per patient request.  Follow-up as needed.  Generalized headaches Patient presents today with headaches that oscillate in intensity from 3-6 out of 10 throughout the day.  She reports that she has been taking ibuprofen for this but she is not sure if this is providing adequate relief or not.  She denies recent head trauma or injury and does admit to increased fatigue for the past week.  Limited neurological exam appears overall normal and reassuring.  Will check CMP, CBC for signs of anemia or electrolyte abnormalities.  Reviewed with patient that headaches can have multiple etiologies and rule out in urgent care can be limited with available testing and workup.  For now recommend continued over-the-counter medication, staying hydrated and eating regularly throughout the day.  Unsure if headaches may be related to elevated blood pressure readings.  Recommend monitoring blood pressure at home and recording results to discuss this with PCP once established.  Reviewed that more data points would be  necessary to establish a diagnosis of hypertension so home measures would be valuable for PCP evaluation.  Results of lab work to dictate further management.  Recommend follow-up with PCP for ongoing monitoring and management should that be necessary.  ED and  return precautions reviewed and provided in after visit summary.  Follow-up as needed.    Discharge Instructions      You were seen today for concerns of abdominal discomfort, burping and bowel habit changes as well as headaches. The symptoms can have a variety of causes that can be difficult to narrow down. At this time some of your abdominal concerns appear consistent with acid reflux or GERD.  I recommend starting with an acid reduction medication such as Pepcid once a day for at least 2 weeks.  I also recommend taking an antacid such as Pepto or Tums if you are having more severe symptoms. For your bowel habit changes I recommend trying changes to your diet such as increasing your fiber and avoiding GERD triggers.  I have included some patient education materials in your after visit summary for you to review to help with this.  If at any point you start to have severe abdominal pain, fever or chills that are not responding to over-the-counter medication, blood in your stool, difficulty having a bowel movement, severe nausea vomiting or diarrhea that prevent you from eating or drinking please go to the emergency room as these could be signs of a medical emergency.  Your headache symptoms are another condition that can have several different causes.  One could be mild dehydration or electrolyte imbalance caused by decreased eating.  Another could be elevated blood pressure.  Your blood pressure was mildly elevated here in clinic today but we typically do not start blood pressure medications for 1 individual blood pressure reading. I usually recommend that patients check their blood pressure a few times a week and record this and bring in records to discuss with her PCP.  Blood pressures taken at home and what can be seen in doctors offices typically provides a much more clear picture that is more accurate to determine if someone has high blood pressure and medication is indicated. For your  headaches I recommend alternating Tylenol and ibuprofen as needed for pain management.  If at any point you feel like your headaches are getting worse, it becomes the worst headache of your life, hits you very suddenly like a thunderclap, you start to develop difficulty speaking, you develop facial drooping, numbness or tingling on one side of the body, loss of consciousness please call EMS or go to the emergency room for further evaluation as these could be signs of a medical emergency.  We will keep you updated on the results of your lab work once it is available.  If any medications or adjustments to your management plan need to be made we will keep you updated and informed of those changes.  Since we were not able to assist you with establishing at a primary care provider's office I have linked the website you can use to make yourself an appointment at your earliest convenience for further evaluation as I do recommend ongoing evaluation for your concerns especially if they are not getting better.  [email or web address]       ED Prescriptions   None    PDMP not reviewed this encounter.   Jerona Mooring, PA-C 07/03/23 1843    Jaran Sainz, Pearla Bottom, PA-C 07/03/23 1845

## 2023-07-04 LAB — LIPASE: Lipase: 51 U/L (ref 14–72)

## 2023-07-04 LAB — COMPREHENSIVE METABOLIC PANEL WITH GFR
ALT: 10 IU/L (ref 0–32)
AST: 15 IU/L (ref 0–40)
Albumin: 4.7 g/dL (ref 3.9–4.9)
Alkaline Phosphatase: 97 IU/L (ref 44–121)
BUN/Creatinine Ratio: 16 (ref 9–23)
BUN: 14 mg/dL (ref 6–24)
Bilirubin Total: 0.3 mg/dL (ref 0.0–1.2)
CO2: 24 mmol/L (ref 20–29)
Calcium: 9.5 mg/dL (ref 8.7–10.2)
Chloride: 101 mmol/L (ref 96–106)
Creatinine, Ser: 0.87 mg/dL (ref 0.57–1.00)
Globulin, Total: 2 g/dL (ref 1.5–4.5)
Glucose: 95 mg/dL (ref 70–99)
Potassium: 4.3 mmol/L (ref 3.5–5.2)
Sodium: 140 mmol/L (ref 134–144)
Total Protein: 6.7 g/dL (ref 6.0–8.5)
eGFR: 81 mL/min/{1.73_m2} (ref >59)

## 2023-07-04 LAB — CBC WITH DIFFERENTIAL/PLATELET
Basophils Absolute: 0 10*3/uL (ref 0.0–0.2)
Basos: 0 %
EOS (ABSOLUTE): 0.1 10*3/uL (ref 0.0–0.4)
Eos: 1 %
Hematocrit: 38.5 % (ref 34.0–46.6)
Hemoglobin: 12.6 g/dL (ref 11.1–15.9)
Immature Grans (Abs): 0 10*3/uL (ref 0.0–0.1)
Immature Granulocytes: 0 %
Lymphocytes Absolute: 1.9 10*3/uL (ref 0.7–3.1)
Lymphs: 39 %
MCH: 30.1 pg (ref 26.6–33.0)
MCHC: 32.7 g/dL (ref 31.5–35.7)
MCV: 92 fL (ref 79–97)
Monocytes Absolute: 0.3 10*3/uL (ref 0.1–0.9)
Monocytes: 7 %
Neutrophils Absolute: 2.6 10*3/uL (ref 1.4–7.0)
Neutrophils: 53 %
Platelets: 316 10*3/uL (ref 150–450)
RBC: 4.19 x10E6/uL (ref 3.77–5.28)
RDW: 12.9 % (ref 11.7–15.4)
WBC: 4.9 10*3/uL (ref 3.4–10.8)

## 2023-07-04 NOTE — Progress Notes (Signed)
 Your electrolytes, liver and kidney function were overall normal at this time Your lipase was in normal range and thus does not show obvious signs of pancreatic inflammation. Your CBC was normal - no signs of elevated white blood count (this can sometimes indicate infection) or anemia. No changes to management plan at this time.

## 2023-08-18 ENCOUNTER — Ambulatory Visit: Payer: Self-pay | Admitting: Family Medicine
